# Patient Record
Sex: Female | Born: 1965 | Race: White | Marital: Married | Smoking: Never smoker
Health system: Southern US, Community
[De-identification: ages and names within clinical notes are randomized; demographics above are authoritative.]

---

## 2015-08-29 ENCOUNTER — Encounter: Payer: Self-pay | Admitting: Family Medicine

## 2015-08-29 ENCOUNTER — Ambulatory Visit (INDEPENDENT_AMBULATORY_CARE_PROVIDER_SITE_OTHER): Payer: No Typology Code available for payment source | Admitting: Family Medicine

## 2015-08-29 VITALS — BP 127/85 | HR 79 | Ht 65.0 in | Wt 145.0 lb

## 2015-08-29 DIAGNOSIS — M7660 Achilles tendinitis, unspecified leg: Secondary | ICD-10-CM

## 2015-08-29 DIAGNOSIS — M67879 Other specified disorders of synovium and tendon, unspecified ankle and foot: Secondary | ICD-10-CM | POA: Diagnosis not present

## 2015-08-29 MED ORDER — NITROGLYCERIN 0.2 MG/HR TD PT24: MEDICATED_PATCH | TRANSDERMAL | Status: DC

## 2015-08-29 NOTE — Patient Instructions (Signed)
You have an achilles strain (~10% partial non insertional tear on ultrasound). Boot with heel lift when up and walking around. Icing 15 minutes at a time 3-4 times a day. Ibuprofen 600mg  three times a day with food OR aleve 2 tabs twice a day with food for pain and inflammation for 7-10 days then as needed. Come out of the boot to do up/down and alphabet exercises for motion twice a day. Nitro patches 1/4th patch over affected area, change daily. Follow up with me in 2 weeks - expect to start theraband exercises, advance to calf raises at that time.

## 2015-08-30 DIAGNOSIS — M7660 Achilles tendinitis, unspecified leg: Secondary | ICD-10-CM | POA: Insufficient documentation

## 2015-08-30 NOTE — Progress Notes (Signed)
PCP: No primary care provider on file.  Subjective:   HPI: Patient is a 50 y.o. female here for left heel pain.  Patient reports she's had prior issues with left achilles. Told had a tear about 7 years ago - wore boot for 6 weeks and recovered. 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 When stepping back for an overhead shot on 4/24 she felt a sharp click in left achilles. Some swelling. Has been icing since then. Pain down to 2/10 - was sharp but now more dull and posterior. No skin changes, numbness.  No past medical history on file.  No current outpatient prescriptions on file prior to visit.   No current facility-administered medications on file prior to visit.    No past surgical history on file.  Allergies  Allergen Reactions  . Sulfa Antibiotics     Social History   Social History  . Marital Status: Married    Spouse Name: N/A  . Number of Children: N/A  . Years of Education: N/A   Occupational History  . Not on file.   Social History Main Topics  . Smoking status: Never Smoker   . Smokeless tobacco: Not on file  . Alcohol Use: Not on file  . Drug Use: Not on file  . Sexual Activity: Not on file   Other Topics Concern  . Not on file   Social History Narrative  . No narrative on file    No family history on file.  BP 127/85 mmHg  Pulse 79  Ht 5\' 5"  (1.651 m)  Wt 145 lb (65.772 kg)  BMI 24.13 kg/m2  Review of Systems: See HPI above.    Objective:  Physical Exam:  Gen: NAD, comfortable in exam room  Left foot/ankle: Small nodule in achilles about 2 cm proximal to insertion on calcaneus. FROM ankle with mild pain on full dorsiflexion. TTP in nodular area noted above.  No other tenderness. Negative ant drawer and talar tilt.   Negative syndesmotic compression. Negative calcaneal squeeze. Thompsons test negative. NV intact distally.  Right foot/ankle: FROM without pain.  MSK u/s:  Thickening of achilles 2 cm proximal to insertion measuring about  0.9 cm.  Small tear in this area without neovascularity roughly 10% of achilles volume at most.  No other abnormalities.    Assessment & Plan:  1. Left achilles tendon strain - small 10% non insertional tear seen on ultrasound.  Start with boot with heel lift.  Icing, nsaids.  Shown home exercises to do daily.  Nitro patches daily - discussed risks of headache, skin irritation.  F/u in 2 weeks.

## 2015-08-30 NOTE — Assessment & Plan Note (Signed)
Left achilles tendon strain - small 10% non insertional tear seen on ultrasound.  Start with boot with heel lift.  Icing, nsaids.  Shown home exercises to do daily.  Nitro patches daily - discussed risks of headache, skin irritation.  F/u in 2 weeks.

## 2015-09-08 ENCOUNTER — Encounter: Payer: Self-pay | Admitting: Family Medicine

## 2015-09-08 ENCOUNTER — Ambulatory Visit (INDEPENDENT_AMBULATORY_CARE_PROVIDER_SITE_OTHER): Payer: No Typology Code available for payment source | Admitting: Family Medicine

## 2015-09-08 VITALS — BP 122/82 | HR 76 | Ht 65.0 in | Wt 145.0 lb

## 2015-09-08 DIAGNOSIS — M67879 Other specified disorders of synovium and tendon, unspecified ankle and foot: Secondary | ICD-10-CM | POA: Diagnosis not present

## 2015-09-08 DIAGNOSIS — M7660 Achilles tendinitis, unspecified leg: Secondary | ICD-10-CM

## 2015-09-08 NOTE — Progress Notes (Signed)
PCP: No PCP Per Patient  Subjective:   HPI: Patient is a 50 y.o. female here for left heel pain.  4/25: Patient reports she's had prior issues with left achilles. Told had a tear about 7 years ago - wore boot for 6 weeks and recovered. 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 When stepping back for an overhead shot on 4/24 she felt a sharp click in left achilles. Some swelling. Has been icing since then. Pain down to 2/10 - was sharp but now more dull and posterior. No skin changes, numbness.  5/5: Patient reports she feels improved. Pain currently 0/10 level. Only some achiness in calf - she tried riding bike with boot and thinks this contributed to this. Doing motion exercises, tolerating nitro and taking motrin regularly. No skin changes, numbness.  No past medical history on file.  Current Outpatient Prescriptions on File Prior to Visit  Medication Sig Dispense Refill  . nitroGLYCERIN (NITRODUR - DOSED IN MG/24 HR) 0.2 mg/hr patch Apply 1/4th patch to affected achilles, change daily 30 patch 1   No current facility-administered medications on file prior to visit.    No past surgical history on file.  Allergies  Allergen Reactions  . Sulfa Antibiotics     Social History   Social History  . Marital Status: Married    Spouse Name: N/A  . Number of Children: N/A  . Years of Education: N/A   Occupational History  . Not on file.   Social History Main Topics  . Smoking status: Never Smoker   . Smokeless tobacco: Not on file  . Alcohol Use: Not on file  . Drug Use: Not on file  . Sexual Activity: Not on file   Other Topics Concern  . Not on file   Social History Narrative    No family history on file.  BP 122/82 mmHg  Pulse 76  Ht 5\' 5"  (1.651 m)  Wt 145 lb (65.772 kg)  BMI 24.13 kg/m2  Review of Systems: See HPI above.    Objective:  Physical Exam:  Gen: NAD, comfortable in exam room  Left foot/ankle: Small nodule in achilles about 2 cm proximal to  insertion on calcaneus. FROM ankle without pain. Minimal TTP in nodular area noted above.  No other tenderness. Negative ant drawer and talar tilt.   Negative syndesmotic compression. Negative calcaneal squeeze. Thompsons test negative. NV intact distally.  Right foot/ankle: FROM without pain.  MSK u/s:  Thickening of achilles 2 cm proximal to insertion measuring about 0.9 cm.  Small tear in the area appears to have healed - no neovascularity or hypoechoic area present.    Assessment & Plan:  1. Left achilles tendon strain - small 10% non insertional tear seen on ultrasound last visit.  Clinically healing as well as healing on ultrasound.  Continue with nitro patches.  Start home exercises (calf raises) and advance these over the next few weeks.  Out of boot and into supportive shoe with heel lift.  Icing, motrin only if needed.  F/u in 4 weeks.

## 2015-09-08 NOTE — Assessment & Plan Note (Signed)
Left achilles tendon strain - small 10% non insertional tear seen on ultrasound last visit.  Clinically healing as well as healing on ultrasound.  Continue with nitro patches.  Start home exercises (calf raises) and advance these over the next few weeks.  Out of boot and into supportive shoe with heel lift.  Icing, motrin only if needed.  F/u in 4 weeks.

## 2015-09-08 NOTE — Patient Instructions (Signed)
Switch to a supportive shoe with heel lift now. Continue nitro patches for 4 more weeks. Use motrin only if needed. Icing also as needed. Start 2 legged calf raise 3 sets of 10 once a day - if do well with this in a few days do single leg calf raises. Wait 2 weeks before trying the drop/lowering on a step exercise 3 sets of 10 once a day. Ok to hit stationary now. If doing well you can add some movement in 2 weeks and play but I wouldn't serve until I see you back. Follow up with me in 4 weeks for reevaluation.

## 2015-10-06 ENCOUNTER — Ambulatory Visit (INDEPENDENT_AMBULATORY_CARE_PROVIDER_SITE_OTHER): Payer: No Typology Code available for payment source | Admitting: Family Medicine

## 2015-10-06 ENCOUNTER — Encounter: Payer: Self-pay | Admitting: Family Medicine

## 2015-10-06 VITALS — BP 125/85 | HR 82 | Ht 65.0 in | Wt 145.0 lb

## 2015-10-06 DIAGNOSIS — M7660 Achilles tendinitis, unspecified leg: Secondary | ICD-10-CM

## 2015-10-06 DIAGNOSIS — M67879 Other specified disorders of synovium and tendon, unspecified ankle and foot: Secondary | ICD-10-CM

## 2015-10-09 NOTE — Progress Notes (Signed)
PCP: No PCP Per Patient  Subjective:   HPI: Patient is a 50 y.o. female here for left heel pain.  4/25: Patient reports she's had prior issues with left achilles. Told had a tear about 7 years ago - wore boot for 6 weeks and recovered. 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 When stepping back for an overhead shot on 4/24 she felt a sharp click in left achilles. Some swelling. Has been icing since then. Pain down to 2/10 - was sharp but now more dull and posterior. No skin changes, numbness.  5/5: Patient reports she feels improved. Pain currently 0/10 level. Only some achiness in calf - she tried riding bike with boot and thinks this contributed to this. Doing motion exercises, tolerating nitro and taking motrin regularly. No skin changes, numbness.  6/2: Patient reports she has improved. Doing home exercises - can do one legged calf raises now. Has no pain. Using nitro patches. Using heel lifts also. No skin changes, numbness.  No past medical history on file.  Current Outpatient Prescriptions on File Prior to Visit  Medication Sig Dispense Refill  . nitroGLYCERIN (NITRODUR - DOSED IN MG/24 HR) 0.2 mg/hr patch Apply 1/4th patch to affected achilles, change daily 30 patch 1   No current facility-administered medications on file prior to visit.    No past surgical history on file.  Allergies  Allergen Reactions  . Sulfa Antibiotics     Social History   Social History  . Marital Status: Married    Spouse Name: N/A  . Number of Children: N/A  . Years of Education: N/A   Occupational History  . Not on file.   Social History Main Topics  . Smoking status: Never Smoker   . Smokeless tobacco: Not on file  . Alcohol Use: Not on file  . Drug Use: Not on file  . Sexual Activity: Not on file   Other Topics Concern  . Not on file   Social History Narrative    No family history on file.  BP 125/85 mmHg  Pulse 82  Ht 5\' 5"  (1.651 m)  Wt 145 lb (65.772 kg)  BMI 24.13  kg/m2  Review of Systems: See HPI above.    Objective:  Physical Exam:  Gen: NAD, comfortable in exam room  Left foot/ankle: Small nodule in achilles about 2 cm proximal to insertion on calcaneus. FROM ankle without pain.  Able to do single calf raise without pain. No TTP in nodular area noted above.  No other tenderness. Negative ant drawer and talar tilt.   Negative syndesmotic compression. Negative calcaneal squeeze. Thompsons test negative. NV intact distally.  Right foot/ankle: FROM without pain.    Assessment & Plan:  1. Left achilles tendon strain - small 10% non insertional tear seen on ultrasound.  Clinically healed now and last visit noted healing on ultrasound.  Continue nitro patches for 6 more weeks if getting benefit.  She will advance her tennis playing - now hitting with a little side to side movement.  Icing, motrin if needed.  F/u prn.

## 2015-10-09 NOTE — Assessment & Plan Note (Signed)
Left achilles tendon strain - small 10% non insertional tear seen on ultrasound.  Clinically healed now and last visit noted healing on ultrasound.  Continue nitro patches for 6 more weeks if getting benefit.  She will advance her tennis playing - now hitting with a little side to side movement.  Icing, motrin if needed.  F/u prn.

## 2016-06-11 ENCOUNTER — Encounter: Payer: Self-pay | Admitting: Family Medicine

## 2016-06-11 ENCOUNTER — Ambulatory Visit (HOSPITAL_BASED_OUTPATIENT_CLINIC_OR_DEPARTMENT_OTHER)
Admission: RE | Admit: 2016-06-11 | Discharge: 2016-06-11 | Disposition: A | Discharge status: Home / Self Care | Payer: No Typology Code available for payment source | Source: Ambulatory Visit | Attending: Family Medicine | Admitting: Family Medicine

## 2016-06-11 ENCOUNTER — Ambulatory Visit (INDEPENDENT_AMBULATORY_CARE_PROVIDER_SITE_OTHER): Payer: No Typology Code available for payment source | Admitting: Family Medicine

## 2016-06-11 VITALS — BP 127/89 | HR 72 | Ht 65.0 in | Wt 140.0 lb

## 2016-06-11 DIAGNOSIS — X58XXXA Exposure to other specified factors, initial encounter: Secondary | ICD-10-CM | POA: Insufficient documentation

## 2016-06-11 DIAGNOSIS — M25521 Pain in right elbow: Secondary | ICD-10-CM

## 2016-06-11 DIAGNOSIS — S59901A Unspecified injury of right elbow, initial encounter: Secondary | ICD-10-CM | POA: Insufficient documentation

## 2016-06-11 NOTE — Patient Instructions (Signed)
This is consistent more with a strain of your supinator than sprain of the anular ligament. 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 Ibuprofen or aleve - consider regularly for 7-10 days then as needed. I don't think you need to ice this except as needed. I'd expect 4-6 weeks to feel completely better but I wouldn't play tennis for 2 weeks minimum then work on light hitting close to the net or against wall for 15-30 minutes, increase from there. Follow up with me if you're still struggling over 4 weeks.

## 2016-06-12 DIAGNOSIS — M25521 Pain in right elbow: Secondary | ICD-10-CM | POA: Insufficient documentation

## 2016-06-12 NOTE — Assessment & Plan Note (Signed)
started with hyperextension, supination injury.  Independently reviewed radiographs and no evidence fracture.  Brief MSK u/s without abnormalities of extensor, flexor tendons or biceps tendon.  Consistent with strain of supinator.  Rest for 1-2 weeks then shown home exercises to start daily.  Ibuprofen or aleve for 7-10 days then as needed.  Discussed easing back into tennis after short period of rest.  F/u in 4 weeks if still struggling.

## 2016-06-12 NOTE — Progress Notes (Signed)
PCP: No PCP Per Patient  Subjective:   HPI: Patient is a 51 y.o. female here for right elbow pain.  Patient reports she had initially fell down the stairs on 12/10. She recovered from this though. Then last week was playing tennis and hyperextended her right elbow when trying to hit the ball. Felt pain laterally. Couldn't serve after this. Pain level down to 2/10, worse with rotation and full extension, dull. No skin changes, numbness. No bruising.  No past medical history on file.  No current outpatient prescriptions on file prior to visit.   No current facility-administered medications on file prior to visit.     No past surgical history on file.  Allergies  Allergen Reactions  . Sulfa Antibiotics     Social History   Social History  . Marital status: Married    Spouse name: N/A  . Number of children: N/A  . Years of education: N/A   Occupational History  . Not on file.   Social History Main Topics  . Smoking status: Never Smoker  . Smokeless tobacco: Never Used  . Alcohol use Not on file  . Drug use: Unknown  . Sexual activity: Not on file   Other Topics Concern  . Not on file   Social History Narrative  . No narrative on file    No family history on file.  BP 127/89   Pulse 72   Ht 5\' 5"  (1.651 m)   Wt 140 lb (63.5 kg)   LMP 05/22/2016   BMI 23.30 kg/m   Review of Systems: See HPI above.     Objective:  Physical Exam:  Gen: NAD, comfortable in exam room  Right elbow: No gross deformity, swelling, bruising. No focal tenderness of lateral epicondyle, radial head, olecranon, medial epicondyle.  Pain localizes anteriorly lateral to biceps tendon. FROM with pain on supination.  No pain on resisted wrist flexion/extension, 3rd digit extension, elbow flexion/extension. Collateral ligaments intact. NVI distally.  Left elbow: FROM without pain.   Assessment & Plan:  1. Right elbow pain - started with hyperextension, supination injury.   Independently reviewed radiographs and no evidence fracture.  Brief MSK u/s without abnormalities of extensor, flexor tendons or biceps tendon.  Consistent with strain of supinator.  Rest for 1-2 weeks then shown home exercises to start daily.  Ibuprofen or aleve for 7-10 days then as needed.  Discussed easing back into tennis after short period of rest.  F/u in 4 weeks if still struggling.

## 2018-04-22 IMAGING — DX DG ELBOW COMPLETE 3+V*R*
4 series · 4 of 4 positions shown · non-contrast
Comparison: None in PACs

CLINICAL DATA: Upper extension injury of the elbow 2 weeks ago
while playing tennis. The patient has persistent pain over the
radial head.

EXAM:
RIGHT ELBOW - COMPLETE 3+ VIEW

[elbow ap]
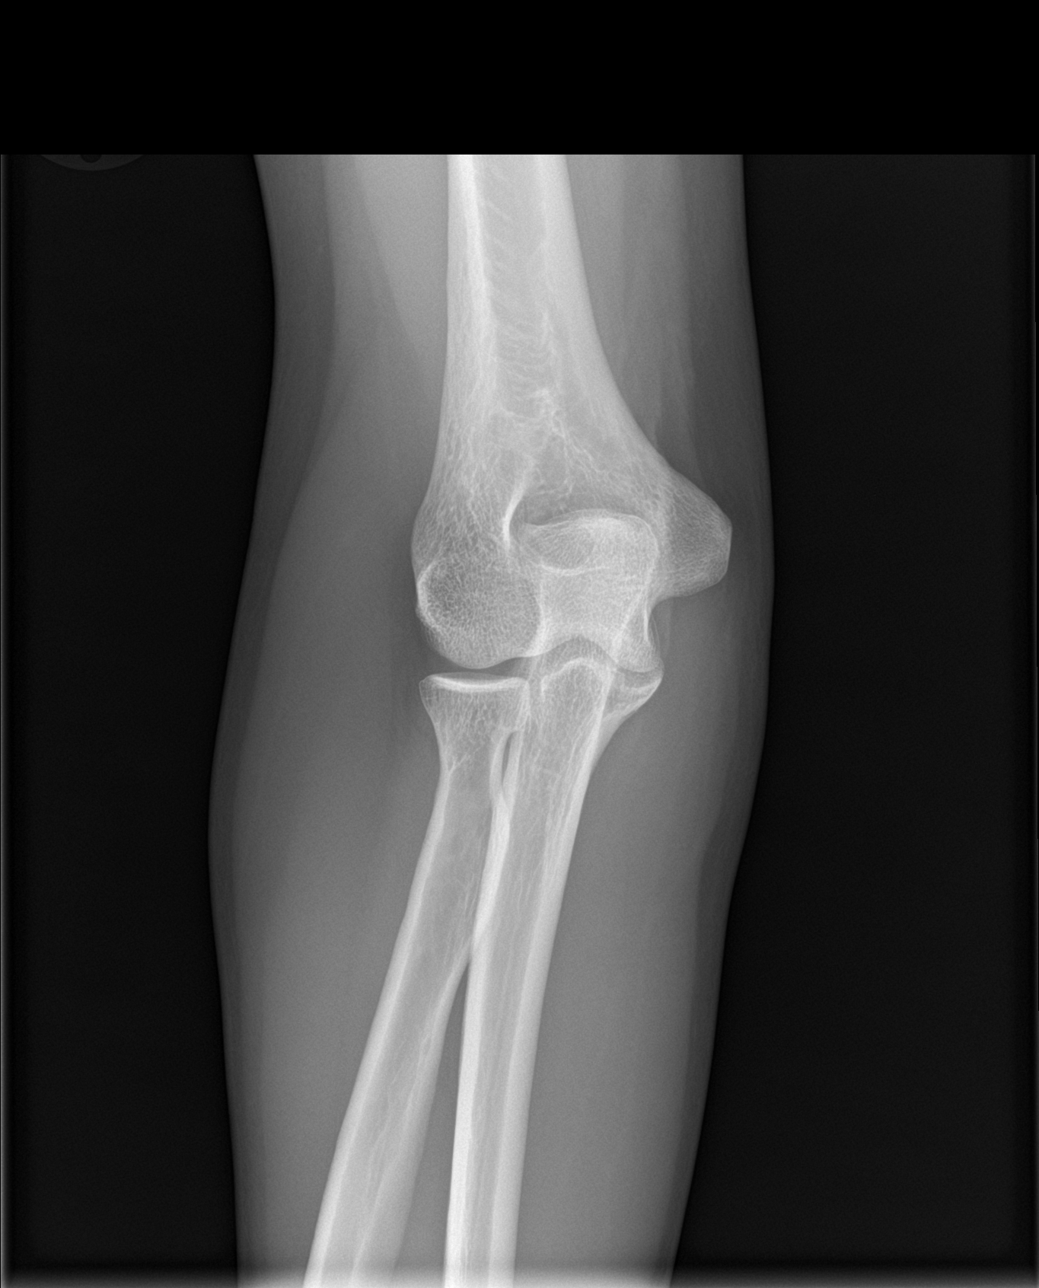

[elbow obl (1 of 2)]
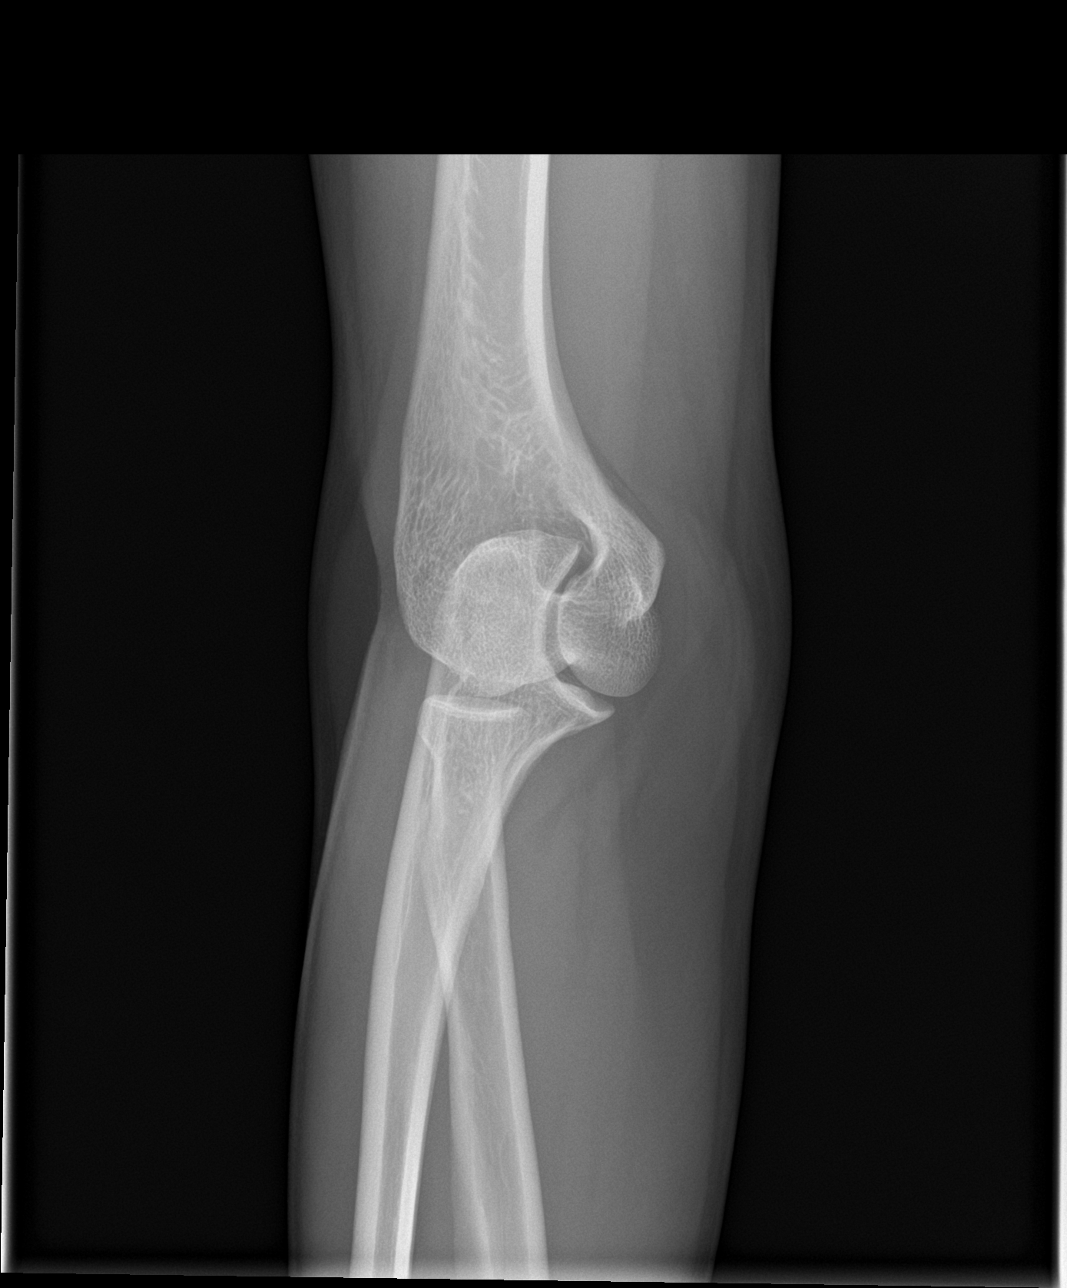

[elbow obl (2 of 2)]
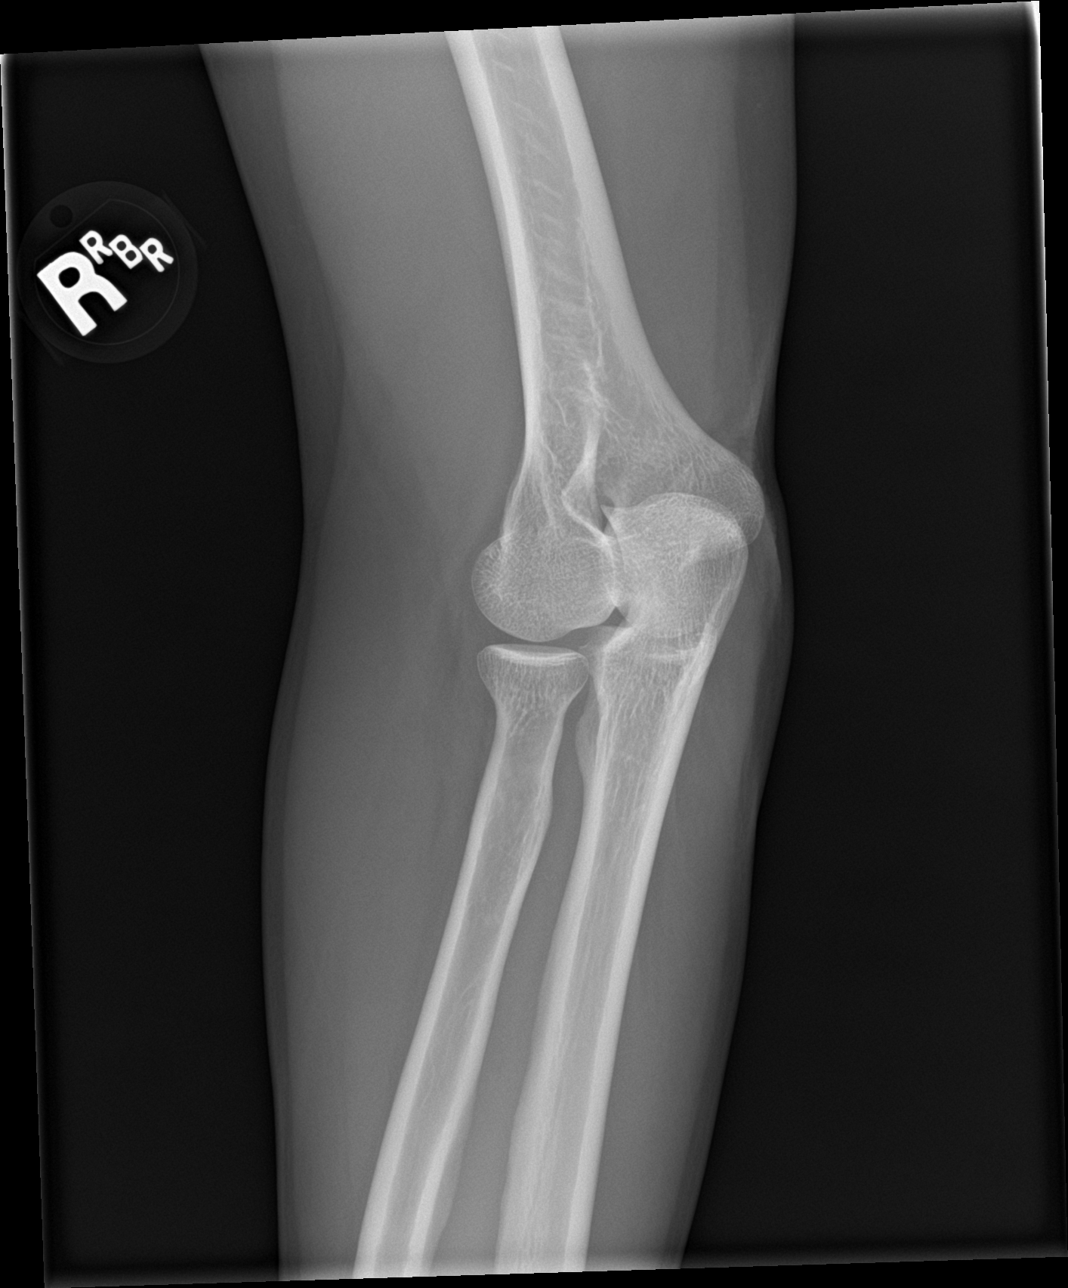

[elbow lat]
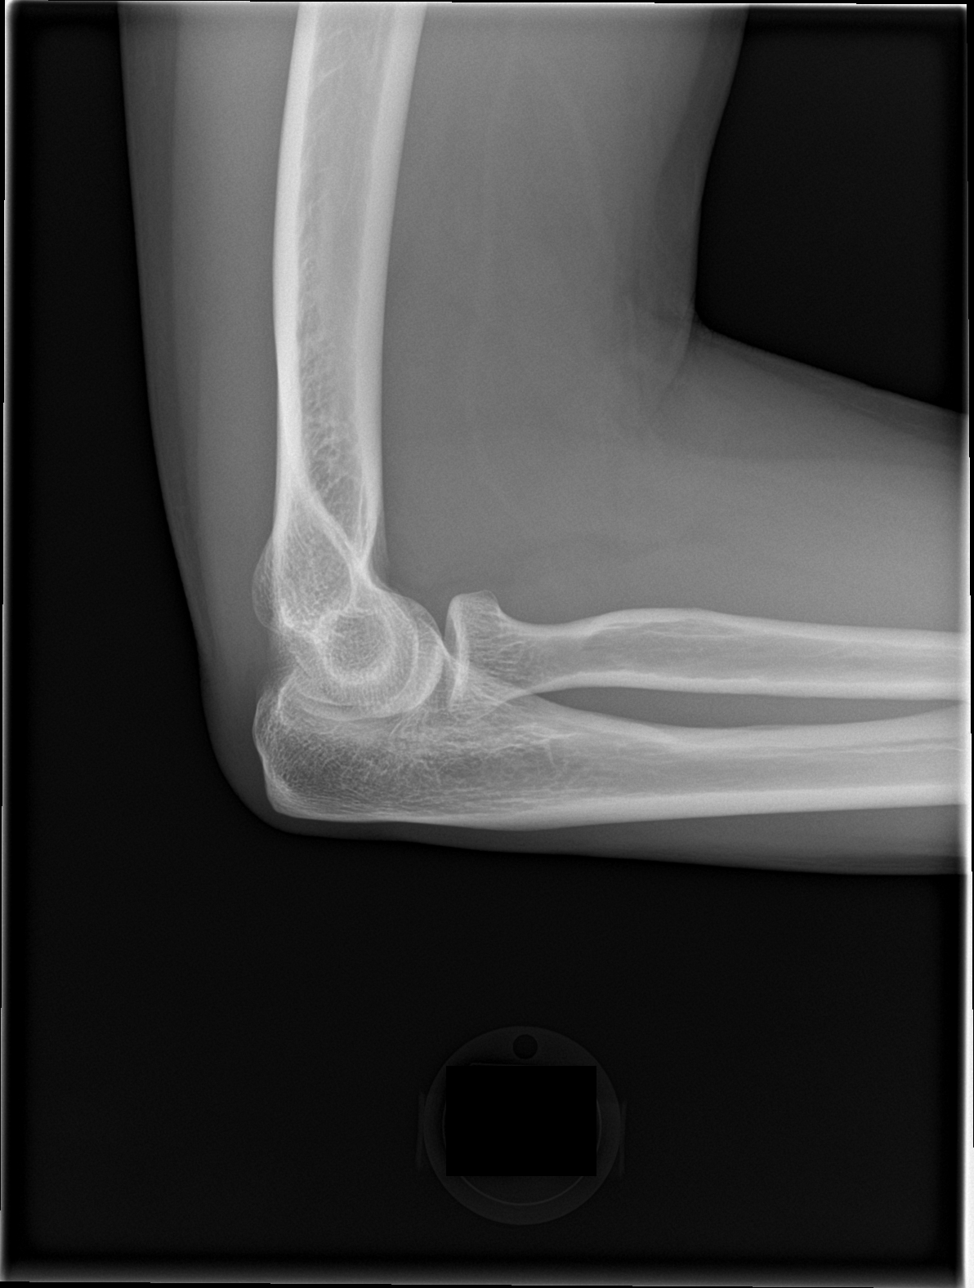

[4 of 4 positions shown; findings below may reference images not displayed]

FINDINGS: The bones are subjectively adequately mineralized. There is no acute
or healing fracture. There is no joint effusion. The radial head is
intact. The adjacent ulna as well as the distal humerus also appears
normal.
IMPRESSION: There is no acute or significant chronic bony abnormality of the
right elbow.

If the patient's symptoms persist and further imaging is warranted,
MRI would be a useful next imaging modality.
